# Patient Record
Sex: Male | Born: 1985 | Race: White | Hispanic: No | Marital: Single | Smoking: Current every day smoker
Health system: Southern US, Community
[De-identification: ages and names within clinical notes are randomized; demographics above are authoritative.]

## PROBLEM LIST (undated history)

## (undated) DIAGNOSIS — F909 Attention-deficit hyperactivity disorder, unspecified type: Secondary | ICD-10-CM

## (undated) HISTORY — DX: Attention-deficit hyperactivity disorder, unspecified type: F90.9

---
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
## 2003-03-24 ENCOUNTER — Encounter: Admission: RE | Admit: 2003-03-24 | Discharge: 2003-03-24 | Payer: Self-pay | Admitting: Internal Medicine

## 2003-03-24 ENCOUNTER — Encounter: Payer: Self-pay | Admitting: Internal Medicine

## 2009-06-23 ENCOUNTER — Ambulatory Visit: Payer: Self-pay | Admitting: Family Medicine

## 2009-06-24 ENCOUNTER — Telehealth (INDEPENDENT_AMBULATORY_CARE_PROVIDER_SITE_OTHER): Payer: Self-pay | Admitting: *Deleted

## 2009-06-24 LAB — CONVERTED CEMR LAB
ALT: 18 units/L (ref 0–53)
AST: 19 units/L (ref 0–37)
Albumin: 4.1 g/dL (ref 3.5–5.2)
Alkaline Phosphatase: 73 units/L (ref 39–117)
BUN: 11 mg/dL (ref 6–23)
Basophils Absolute: 0 10*3/uL (ref 0.0–0.1)
Basophils Relative: 0.9 % (ref 0.0–3.0)
Bilirubin, Direct: 0 mg/dL (ref 0.0–0.3)
CO2: 31 meq/L (ref 19–32)
Calcium: 9 mg/dL (ref 8.4–10.5)
Chlamydia, Swab/Urine, PCR: NEGATIVE
Chloride: 108 meq/L (ref 96–112)
Creatinine, Ser: 0.9 mg/dL (ref 0.4–1.5)
Eosinophils Absolute: 0.1 10*3/uL (ref 0.0–0.7)
Eosinophils Relative: 2.4 % (ref 0.0–5.0)
GC Probe Amp, Urine: NEGATIVE
GFR calc non Af Amer: 109.02 mL/min (ref >60)
Glucose, Bld: 87 mg/dL (ref 70–99)
HCT: 42.3 % (ref 39.0–52.0)
Hemoglobin: 14.2 g/dL (ref 13.0–17.0)
Lymphocytes Relative: 27.9 % (ref 12.0–46.0)
Lymphs Abs: 1.4 10*3/uL (ref 0.7–4.0)
MCHC: 33.6 g/dL (ref 30.0–36.0)
MCV: 94.9 fL (ref 78.0–100.0)
Monocytes Absolute: 0.4 10*3/uL (ref 0.1–1.0)
Monocytes Relative: 8.3 % (ref 3.0–12.0)
Neutro Abs: 3.2 10*3/uL (ref 1.4–7.7)
Neutrophils Relative %: 60.5 % (ref 43.0–77.0)
Platelets: 174 10*3/uL (ref 150.0–400.0)
Potassium: 4 meq/L (ref 3.5–5.1)
RBC: 4.46 M/uL (ref 4.22–5.81)
RDW: 12.3 % (ref 11.5–14.6)
Sodium: 141 meq/L (ref 135–145)
Total Bilirubin: 0.7 mg/dL (ref 0.3–1.2)
Total Protein: 7.2 g/dL (ref 6.0–8.3)
WBC: 5.1 10*3/uL (ref 4.5–10.5)

## 2009-07-28 ENCOUNTER — Ambulatory Visit: Payer: Self-pay | Admitting: Family Medicine

## 2009-08-27 ENCOUNTER — Ambulatory Visit: Payer: Self-pay | Admitting: Family Medicine

## 2011-01-10 ENCOUNTER — Ambulatory Visit: Payer: Self-pay | Admitting: Family Medicine

## 2011-01-10 ENCOUNTER — Encounter: Payer: Self-pay | Admitting: Family Medicine

## 2011-01-10 DIAGNOSIS — Z0289 Encounter for other administrative examinations: Secondary | ICD-10-CM

## 2013-03-05 ENCOUNTER — Ambulatory Visit (INDEPENDENT_AMBULATORY_CARE_PROVIDER_SITE_OTHER): Payer: BC Managed Care – PPO | Admitting: Family Medicine

## 2013-03-05 ENCOUNTER — Ambulatory Visit: Payer: BC Managed Care – PPO

## 2013-03-05 VITALS — BP 144/93 | HR 85 | Temp 97.7°F | Resp 16 | Ht 73.0 in | Wt 182.0 lb

## 2013-03-05 DIAGNOSIS — R52 Pain, unspecified: Secondary | ICD-10-CM

## 2013-03-05 DIAGNOSIS — R079 Chest pain, unspecified: Secondary | ICD-10-CM

## 2013-03-05 MED ORDER — HYDROCODONE-ACETAMINOPHEN 5-325 MG PO TABS: 1.0000 | ORAL_TABLET | Freq: Four times a day (QID) | ORAL | Status: DC | PRN

## 2013-03-05 NOTE — Progress Notes (Signed)
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 The pain is located in the lower left lateral ribs. We was struck by an elbow thrown during the game, he thought he heard a crack in that area. He's had pain with deep inspiration since is having trouble sleeping. He's also having trouble performing duties at work which is quite physical.  He's had no shortness of breath and does not feel any cracking at the present time.  Objective: No acute distress, patient calm and appropriate Chest: No ecchymosis, no swelling, lungs clear to auscultation, tender left lower lateral rib area. Heart: Regular no murmur or gallop Skin: No ecchymosis HEENT: Unremarkable [facility] reading (PRIMARY) by  Dr. Milus Glazier: left rib films: question distal 11th rib avulsion fx .  Assessment:  Presumed rib fx, no effusion  Plan:

## 2013-03-05 NOTE — Patient Instructions (Addendum)
Rib Fracture  Your caregiver has diagnosed you as having a rib fracture (a break). This can occur by a blow to the chest, by a fall against a hard object, or by violent coughing or sneezing. There may be one or many breaks. Rib fractures may heal on their own within 3 to 8 weeks. The longer healing period is usually associated with a continued cough or other aggravating activities.  HOME CARE INSTRUCTIONS    Avoid strenuous activity. Be careful during activities and avoid bumping the injured rib. Activities that cause pain pull on the fracture site(s) and are best avoided if possible.   Eat a normal, well-balanced diet. Drink plenty of fluids to avoid constipation.   Take deep breaths several times a day to keep lungs free of infection. Try to cough several times a day, splinting the injured area with a pillow. This will help prevent pneumonia.   Do not wear a rib belt or binder. These restrict breathing which can lead to pneumonia.   Only take over-the-counter or prescription medicines for pain, discomfort, or fever as directed by your caregiver.  SEEK MEDICAL CARE IF:   You develop a continual cough, associated with thick or bloody sputum.  SEEK IMMEDIATE MEDICAL CARE IF:    You have a fever.   You have difficulty breathing.   You have nausea (feeling sick to your stomach), vomiting, or abdominal (belly) pain.   You have worsening pain, not controlled with medications.  Document Released: 09/12/2005 Document Revised: 12/05/2011 Document Reviewed: 02/14/2007  [employer] Patient Information 2014 [employer].

## 2013-05-16 ENCOUNTER — Encounter: Payer: BC Managed Care – PPO | Admitting: Physician Assistant

## 2013-05-16 NOTE — Progress Notes (Signed)
Encounter opened in error. This encounter was created in error - please disregard. 

## 2013-06-25 ENCOUNTER — Ambulatory Visit (INDEPENDENT_AMBULATORY_CARE_PROVIDER_SITE_OTHER): Payer: BC Managed Care – PPO | Admitting: Family Medicine

## 2013-06-25 VITALS — BP 138/92 | HR 80 | Temp 98.1°F | Resp 16 | Ht 72.25 in | Wt 183.2 lb

## 2013-06-25 DIAGNOSIS — K5289 Other specified noninfective gastroenteritis and colitis: Secondary | ICD-10-CM

## 2013-06-25 DIAGNOSIS — R079 Chest pain, unspecified: Secondary | ICD-10-CM

## 2013-06-25 DIAGNOSIS — K529 Noninfective gastroenteritis and colitis, unspecified: Secondary | ICD-10-CM

## 2013-06-25 MED ORDER — LOPERAMIDE HCL 2 MG PO TABS: 2.0000 mg | ORAL_TABLET | Freq: Four times a day (QID) | ORAL | Status: DC | PRN

## 2013-06-25 MED ORDER — HYDROCODONE-ACETAMINOPHEN 5-325 MG PO TABS: 1.0000 | ORAL_TABLET | Freq: Four times a day (QID) | ORAL | Status: DC | PRN

## 2013-06-25 NOTE — Progress Notes (Signed)
Subjective:    Patient ID: Jeffery Fisher, male    DOB: [birth year], 27 y.o.   MRN: [id number] Chief Complaint  Patient presents with  . Abdominal Pain    1 day  . Nausea    1 day  . Diarrhea    1 day    HPI   Sxs started Sun night - woke in the middle of the night with vomiting and diarrhea.  Has thrown up 4x since then but no vomiting since then.  No f/c/sweats.  Still having diarrhea.  No melena or blood.  Roommate was ill with similar sxs prev.  Urinating but dark and decreased.  Has been able to eat soup today and keeping gatorade down.  History reviewed. No pertinent past medical history. No current outpatient prescriptions on file prior to visit.   No current facility-administered medications on file prior to visit.   No Known Allergies   Review of Systems  Constitutional: Positive for appetite change and fatigue. Negative for fever, chills, diaphoresis, activity change and unexpected weight change.  Respiratory: Negative for shortness of breath.   Cardiovascular: Negative for chest pain.  Gastrointestinal: Positive for nausea, vomiting, abdominal pain and diarrhea. Negative for constipation, blood in stool, abdominal distention, anal bleeding and rectal pain.  Genitourinary: Positive for decreased urine volume. Negative for dysuria, urgency, frequency and hematuria.  Hematological: Negative for adenopathy.      BP 138/92  Pulse 80  Temp(Src) 98.1 F (36.7 C) (Oral)  Resp 16  Ht 6' 0.25" (1.835 m)  Wt 183 lb 3.2 oz (83.099 kg)  BMI 24.68 kg/m2  SpO2 99% Objective:   Physical Exam  Constitutional: He appears well-developed and well-nourished. No distress.  HENT:  Head: Normocephalic and atraumatic.  Neck: Normal range of motion. Neck supple. No thyromegaly present.  Cardiovascular: Normal rate, regular rhythm and normal heart sounds.   Pulmonary/Chest: Effort normal and breath sounds normal.  Abdominal: Soft. Normal appearance and bowel sounds are normal. He  exhibits no distension and no mass. There is no hepatosplenomegaly. There is generalized tenderness (mild). There is no rigidity, no rebound, no guarding, no CVA tenderness and negative Murphy's sign. No hernia.  Genitourinary: Rectum normal and prostate normal. Rectal exam shows no tenderness and anal tone normal. Guaiac negative stool.  Lymphadenopathy:    He has no cervical adenopathy.  Skin: He is not diaphoretic.       Assessment & Plan:   Gastroenteritis  Chest pain - Plan: HYDROcodone-acetaminophen (NORCO) 5-325 MG per tablet Suspect viral and resolving.  May have chest wall strain from the vomiting - no nsaids since will upset stomach but can try tylenol or norco for breakthrough pain which should also help w/ abd cramping and diarrhea.  RTC if continues and does not resolve in the next 1-2d. Meds ordered this encounter  Medications  . HYDROcodone-acetaminophen (NORCO) 5-325 MG per tablet    Sig: Take 1 tablet by mouth every 6 (six) hours as needed for pain.    Dispense:  10 tablet    Refill:  0  . loperamide (IMODIUM A-D) 2 MG tablet    Sig: Take 1 tablet (2 mg total) by mouth 4 (four) times daily as needed for diarrhea or loose stools.    Dispense:  30 tablet    Refill:  0    I personally performed the services described in this documentation, which was scribed in my presence. The recorded information has been reviewed and considered, and addended by me as  needed.  Delman Cheadle, MD MPH

## 2013-06-25 NOTE — Patient Instructions (Addendum)
Viral Gastroenteritis Viral gastroenteritis is also known as stomach flu. This condition affects the stomach and intestinal tract. It can cause sudden diarrhea and vomiting. The illness typically lasts 3 to 8 days. Most people develop an immune response that eventually gets rid of the virus. While this natural response develops, the virus can make you quite ill. CAUSES  Many different viruses can cause gastroenteritis, such as rotavirus or noroviruses. You can catch one of these viruses by consuming contaminated food or water. You may also catch a virus by sharing utensils or other personal items with an infected person or by touching a contaminated surface. SYMPTOMS  The most common symptoms are diarrhea and vomiting. These problems can cause a severe loss of body fluids (dehydration) and a body salt (electrolyte) imbalance. Other symptoms may include:  Fever.  Headache.  Fatigue.  Abdominal pain. DIAGNOSIS  Your caregiver can usually diagnose viral gastroenteritis based on your symptoms and a physical exam. A stool sample may also be taken to test for the presence of viruses or other infections. TREATMENT  This illness typically goes away on its own. Treatments are aimed at rehydration. The most serious cases of viral gastroenteritis involve vomiting so severely that you are not able to keep fluids down. In these cases, fluids must be given through an intravenous line (IV). HOME CARE INSTRUCTIONS   Drink enough fluids to keep your urine clear or pale yellow. Drink small amounts of fluids frequently and increase the amounts as tolerated.  Ask your caregiver for specific rehydration instructions.  Avoid:  Foods high in sugar.  Alcohol.  Carbonated drinks.  Tobacco.  Juice.  Caffeine drinks.  Extremely hot or cold fluids.  Fatty, greasy foods.  Too much intake of anything at one time.  Dairy products until 24 to 48 hours after diarrhea stops.  You may consume probiotics.  Probiotics are active cultures of beneficial bacteria. They may lessen the amount and number of diarrheal stools in adults. Probiotics can be found in yogurt with active cultures and in supplements.  Wash your hands well to avoid spreading the virus.  Only take over-the-counter or prescription medicines for pain, discomfort, or fever as directed by your caregiver. Do not give aspirin to children. Antidiarrheal medicines are not recommended.  Ask your caregiver if you should continue to take your regular prescribed and over-the-counter medicines.  Keep all follow-up appointments as directed by your caregiver. SEEK IMMEDIATE MEDICAL CARE IF:   You are unable to keep fluids down.  You do not urinate at least once every 6 to 8 hours.  You develop shortness of breath.  You notice blood in your stool or vomit. This may look like coffee grounds.  You have abdominal pain that increases or is concentrated in one small area (localized).  You have persistent vomiting or diarrhea.  You have a fever.  The patient is a child younger than 3 months, and he or she has a fever.  The patient is a child older than 3 months, and he or she has a fever and persistent symptoms.  The patient is a child older than 3 months, and he or she has a fever and symptoms suddenly get worse.  The patient is a baby, and he or she has no tears when crying. MAKE SURE YOU:   Understand these instructions.  Will watch your condition.  Will get help right away if you are not doing well or get worse. Document Released: 09/12/2005 Document Revised: 12/05/2011 Document Reviewed: 06/29/2011   [employer] Patient Information 2014 [employer].  

## 2013-11-01 ENCOUNTER — Telehealth: Payer: Self-pay | Admitting: *Deleted

## 2013-11-01 NOTE — Telephone Encounter (Signed)
Appt was made. JG//CMA

## 2013-11-01 NOTE — Telephone Encounter (Signed)
Patient called and requested Adderall for school. He stated that he took it before for school, but stopped when school was out. He stated that he is now back in school. Please advise. JG//CMA

## 2013-11-01 NOTE — Telephone Encounter (Signed)
It doesn't look like he's been here in a while---needs ov

## 2013-11-05 ENCOUNTER — Ambulatory Visit: Payer: BC Managed Care – PPO | Admitting: Family Medicine

## 2013-11-07 ENCOUNTER — Ambulatory Visit (INDEPENDENT_AMBULATORY_CARE_PROVIDER_SITE_OTHER): Payer: BC Managed Care – PPO | Admitting: Family Medicine

## 2013-11-07 ENCOUNTER — Encounter: Payer: Self-pay | Admitting: Family Medicine

## 2013-11-07 VITALS — BP 126/80 | HR 84 | Temp 98.2°F | Ht 72.5 in | Wt 196.0 lb

## 2013-11-07 DIAGNOSIS — F909 Attention-deficit hyperactivity disorder, unspecified type: Secondary | ICD-10-CM

## 2013-11-07 DIAGNOSIS — F17200 Nicotine dependence, unspecified, uncomplicated: Secondary | ICD-10-CM

## 2013-11-07 DIAGNOSIS — Z23 Encounter for immunization: Secondary | ICD-10-CM

## 2013-11-07 MED ORDER — AMPHETAMINE-DEXTROAMPHET ER 20 MG PO CP24: 20.0000 mg | ORAL_CAPSULE | ORAL | Status: DC

## 2013-11-07 NOTE — Patient Instructions (Signed)
Attention Deficit Hyperactivity Disorder Attention deficit hyperactivity disorder (ADHD) is a problem with behavior issues based on the way the brain functions (neurobehavioral disorder). It is a common reason for behavior and academic problems in school. SYMPTOMS  There are 3 types of ADHD. The 3 types and some of the symptoms include:  Inattentive  Gets bored or distracted easily.  Loses or forgets things. Forgets to hand in homework.  Has trouble organizing or completing tasks.  Difficulty staying on task.  An inability to organize daily tasks and school work.  Leaving projects, chores, or homework unfinished.  Trouble paying attention or responding to details. Careless mistakes.  Difficulty following directions. Often seems like is not listening.  Dislikes activities that require sustained attention (like chores or homework).  Hyperactive-impulsive  Feels like it is impossible to sit still or stay in a seat. Fidgeting with hands and feet.  Trouble waiting turn.  Talking too much or out of turn. Interruptive.  Speaks or acts impulsively.  Aggressive, disruptive behavior.  Constantly busy or on the go, noisy.  Often leaves seat when they are expected to remain seated.  Often runs or climbs where it is not appropriate, or feels very restless.  Combined  Has symptoms of both of the above. Often children with ADHD feel discouraged about themselves and with school. They often perform well below their abilities in school. As children get older, the excess motor activities can calm down, but the problems with paying attention and staying organized persist. Most children do not outgrow ADHD but with good treatment can learn to cope with the symptoms. DIAGNOSIS  When ADHD is suspected, the diagnosis should be made by professionals trained in ADHD. This professional will collect information about the individual suspected of having ADHD. Information must be collected from  various settings where the person lives, works, or attends school.  Diagnosis will include:  Confirming symptoms began in childhood.  Ruling out other reasons for the child's behavior.  The health care providers will check with the child's school and check their medical records.  They will talk to teachers and parents.  Behavior rating scales for the child will be filled out by those dealing with the child on a daily basis. A diagnosis is made only after all information has been considered. TREATMENT  Treatment usually includes behavioral treatment, tutoring or extra support in school, and stimulant medicines. Because of the way a person's brain works with ADHD, these medicines decrease impulsivity and hyperactivity and increase attention. This is different than how they would work in a person who does not have ADHD. Other medicines used include antidepressants and certain blood pressure medicines. Most experts agree that treatment for ADHD should address all aspects of the person's functioning. Along with medicines, treatment should include structured classroom management at school. Parents should reward good behavior, provide constant discipline, and limit-setting. Tutoring should be available for the child as needed. ADHD is a life-long condition. If untreated, the disorder can have long-term serious effects into adolescence and adulthood. HOME CARE INSTRUCTIONS   Often with ADHD there is a lot of frustration among family members dealing with the condition. Blame and anger are also feelings that are common. In many cases, because the problem affects the family as a whole, the entire family may need help. A therapist can help the family find better ways to handle the disruptive behaviors of the person with ADHD and promote change. If the person with ADHD is young, most of the therapist's work   is with the parents. Parents will learn techniques for coping with and improving their child's  behavior. Sometimes only the child with the ADHD needs counseling. Your health care providers can help you make these decisions.  Children with ADHD may need help learning how to organize. Some helpful tips include:  Keep routines the same every day from wake-up time to bedtime. Schedule all activities, including homework and playtime. Keep the schedule in a place where the person with ADHD will often see it. Mark schedule changes as far in advance as possible.  Schedule outdoor and indoor recreation.  Have a place for everything and keep everything in its place. This includes clothing, backpacks, and school supplies.  Encourage writing down assignments and bringing home needed books. Work with your child's teachers for assistance in organizing school work.  Offer your child a well-balanced diet. Breakfast that includes a balance of whole grains, protein and, fruits or vegetables is especially important for school performance. Children should avoid drinks with caffeine including:  Soft drinks.  Coffee.  Tea.  However, some older children (adolescents) may find these drinks helpful in improving their attention. Because it can also be common for adolescents with ADHD to become addicted to caffeine, talk with your health care provider about what is a safe amount of caffeine intake for your child.  Children with ADHD need consistent rules that they can understand and follow. If rules are followed, give small rewards. Children with ADHD often receive, and expect, criticism. Look for good behavior and praise it. Set realistic goals. Give clear instructions. Look for activities that can foster success and self-esteem. Make time for pleasant activities with your child. Give lots of affection.  Parents are their children's greatest advocates. Learn as much as possible about ADHD. This helps you become a stronger and better advocate for your child. It also helps you educate your child's teachers and  instructors if they feel inadequate in these areas. Parent support groups are often helpful. A national group with local chapters is called Children and Adults with Attention Deficit Hyperactivity Disorder (CHADD). SEEK MEDICAL CARE IF:  Your child has repeated muscle twitches, cough or speech outbursts.  Your child has sleep problems.  Your child has a marked loss of appetite.  Your child develops depression.  Your child has new or worsening behavioral problems.  Your child develops dizziness.  Your child has a racing heart.  Your child has stomach pains.  Your child develops headaches. SEEK IMMEDIATE MEDICAL CARE IF:  Your child has been diagnosed with depression or anxiety and the symptoms seem to be getting worse.  Your child has been depressed and suddenly appears to have increased energy or motivation.  You are worried that your child is having a bad reaction to a medication he or she is taking for ADHD. Document Released: 09/02/2002 Document Revised: 07/03/2013 Document Reviewed: 05/20/2013 [employer] Patient Information 2014 [employer].  

## 2013-11-07 NOTE — Progress Notes (Signed)
Pre visit review using our clinic review tool, if applicable. No additional management support is needed unless otherwise documented below in the visit note. 

## 2013-11-07 NOTE — Progress Notes (Signed)
Patient ID: Terese DoorRyan E Budden, male   DOB: Dec 07, 1985, 28 y.o.   MRN: [id number]   Subjective:    Patient ID: Terese DoorRyan E Pietsch, male    DOB: Dec 07, 1985, 28 y.o.   MRN: [id number] HPI Pt here to discuss starting back on adderall. He is going back to school and would like to start back.   Adult self questionaire done.         Objective:    BP 126/80  Pulse 84  Temp(Src) 98.2 F (36.8 C) (Oral)  Ht 6' 0.5" (1.842 m)  Wt 196 lb (88.905 kg)  BMI 26.20 kg/m2  SpO2 97% General appearance: alert, cooperative, appears stated age and no distress Throat: lips, mucosa, and tongue normal; teeth and gums normal Neck: no adenopathy, no JVD and thyroid not enlarged, symmetric, no tenderness/mass/nodules Lungs: clear to auscultation bilaterally Heart: S1, S2 normal         Assessment & Plan:  1. ADHD (attention deficit hyperactivity disorder) Restart med rto  1 month - amphetamine-dextroamphetamine (ADDERALL XR) 20 MG 24 hr capsule; Take 1 capsule (20 mg total) by mouth every morning.  Dispense: 30 capsule; Refill: 0  2. Tobacco use disorder Discuss with pt quitting  3. Need for prophylactic vaccination and inoculation against influenza  - Flu Vaccine QUAD 36+ mos PF IM (Fluarix)

## 2013-11-08 ENCOUNTER — Telehealth: Payer: Self-pay | Admitting: Family Medicine

## 2013-11-08 NOTE — Telephone Encounter (Signed)
Relevant patient education assigned to patient using Emmi. ° °

## 2013-12-04 ENCOUNTER — Telehealth: Payer: Self-pay | Admitting: *Deleted

## 2013-12-04 NOTE — Telephone Encounter (Signed)
UDS collected on 2/12/5 and results reviewed by Dr. Laury AxonLowne. Per her written note "Patient + for opiates and THC.

## 2013-12-12 ENCOUNTER — Ambulatory Visit (INDEPENDENT_AMBULATORY_CARE_PROVIDER_SITE_OTHER): Payer: BC Managed Care – PPO | Admitting: Family Medicine

## 2013-12-12 ENCOUNTER — Encounter: Payer: Self-pay | Admitting: Family Medicine

## 2013-12-12 VITALS — BP 120/82 | HR 90 | Temp 97.9°F | Wt 190.0 lb

## 2013-12-12 DIAGNOSIS — F909 Attention-deficit hyperactivity disorder, unspecified type: Secondary | ICD-10-CM

## 2013-12-12 MED ORDER — AMPHETAMINE-DEXTROAMPHET ER 20 MG PO CP24: 20.0000 mg | ORAL_CAPSULE | ORAL | Status: DC

## 2013-12-12 NOTE — Progress Notes (Signed)
Patient ID: Terese DoorRyan E Pribble, male   DOB: [birth year], 28 y.o.   MRN: [id number]   Subjective:    Patient ID: Terese DoorRyan E Parrack, male    DOB: [birth year], 28 y.o.   MRN: [id number] HPI Pt here f/u add meds.  He is doing well and meds wear off just in time to sleep.           Objective:    BP 120/82  Pulse 90  Temp(Src) 97.9 F (36.6 C) (Oral)  Wt 190 lb (86.183 kg)  SpO2 97% General appearance: alert, cooperative, appears stated age and no distress Neurologic: Alert and oriented X 3, normal strength and tone. Normal symmetric reflexes. Normal coordination and gait       Assessment & Plan:  1. ADHD (attention deficit hyperactivity disorder) Stable, con't meds  Recheck 6 months or sooner prn - amphetamine-dextroamphetamine (ADDERALL XR) 20 MG 24 hr capsule; Take 1 capsule (20 mg total) by mouth every morning.  Dispense: 30 capsule; Refill: 0 - amphetamine-dextroamphetamine (ADDERALL XR) 20 MG 24 hr capsule; Take 1 capsule (20 mg total) by mouth every morning.  Dispense: 30 capsule; Refill: 0 - amphetamine-dextroamphetamine (ADDERALL XR) 20 MG 24 hr capsule; Take 1 capsule (20 mg total) by mouth every morning.  Dispense: 30 capsule; Refill: 0

## 2013-12-12 NOTE — Progress Notes (Signed)
Pre visit review using our clinic review tool, if applicable. No additional management support is needed unless otherwise documented below in the visit note. 

## 2013-12-12 NOTE — Patient Instructions (Signed)
Attention Deficit Hyperactivity Disorder Attention deficit hyperactivity disorder (ADHD) is a problem with behavior issues based on the way the brain functions (neurobehavioral disorder). It is a common reason for behavior and academic problems in school. SYMPTOMS  There are 3 types of ADHD. The 3 types and some of the symptoms include:  Inattentive  Gets bored or distracted easily.  Loses or forgets things. Forgets to hand in homework.  Has trouble organizing or completing tasks.  Difficulty staying on task.  An inability to organize daily tasks and school work.  Leaving projects, chores, or homework unfinished.  Trouble paying attention or responding to details. Careless mistakes.  Difficulty following directions. Often seems like is not listening.  Dislikes activities that require sustained attention (like chores or homework).  Hyperactive-impulsive  Feels like it is impossible to sit still or stay in a seat. Fidgeting with hands and feet.  Trouble waiting turn.  Talking too much or out of turn. Interruptive.  Speaks or acts impulsively.  Aggressive, disruptive behavior.  Constantly busy or on the go, noisy.  Often leaves seat when they are expected to remain seated.  Often runs or climbs where it is not appropriate, or feels very restless.  Combined  Has symptoms of both of the above. Often children with ADHD feel discouraged about themselves and with school. They often perform well below their abilities in school. As children get older, the excess motor activities can calm down, but the problems with paying attention and staying organized persist. Most children do not outgrow ADHD but with good treatment can learn to cope with the symptoms. DIAGNOSIS  When ADHD is suspected, the diagnosis should be made by professionals trained in ADHD. This professional will collect information about the individual suspected of having ADHD. Information must be collected from  various settings where the person lives, works, or attends school.  Diagnosis will include:  Confirming symptoms began in childhood.  Ruling out other reasons for the child's behavior.  The health care providers will check with the child's school and check their medical records.  They will talk to teachers and parents.  Behavior rating scales for the child will be filled out by those dealing with the child on a daily basis. A diagnosis is made only after all information has been considered. TREATMENT  Treatment usually includes behavioral treatment, tutoring or extra support in school, and stimulant medicines. Because of the way a person's brain works with ADHD, these medicines decrease impulsivity and hyperactivity and increase attention. This is different than how they would work in a person who does not have ADHD. Other medicines used include antidepressants and certain blood pressure medicines. Most experts agree that treatment for ADHD should address all aspects of the person's functioning. Along with medicines, treatment should include structured classroom management at school. Parents should reward good behavior, provide constant discipline, and limit-setting. Tutoring should be available for the child as needed. ADHD is a life-long condition. If untreated, the disorder can have long-term serious effects into adolescence and adulthood. HOME CARE INSTRUCTIONS   Often with ADHD there is a lot of frustration among family members dealing with the condition. Blame and anger are also feelings that are common. In many cases, because the problem affects the family as a whole, the entire family may need help. A therapist can help the family find better ways to handle the disruptive behaviors of the person with ADHD and promote change. If the person with ADHD is young, most of the therapist's work   is with the parents. Parents will learn techniques for coping with and improving their child's  behavior. Sometimes only the child with the ADHD needs counseling. Your health care providers can help you make these decisions.  Children with ADHD may need help learning how to organize. Some helpful tips include:  Keep routines the same every day from wake-up time to bedtime. Schedule all activities, including homework and playtime. Keep the schedule in a place where the person with ADHD will often see it. Mark schedule changes as far in advance as possible.  Schedule outdoor and indoor recreation.  Have a place for everything and keep everything in its place. This includes clothing, backpacks, and school supplies.  Encourage writing down assignments and bringing home needed books. Work with your child's teachers for assistance in organizing school work.  Offer your child a well-balanced diet. Breakfast that includes a balance of whole grains, protein and, fruits or vegetables is especially important for school performance. Children should avoid drinks with caffeine including:  Soft drinks.  Coffee.  Tea.  However, some older children (adolescents) may find these drinks helpful in improving their attention. Because it can also be common for adolescents with ADHD to become addicted to caffeine, talk with your health care provider about what is a safe amount of caffeine intake for your child.  Children with ADHD need consistent rules that they can understand and follow. If rules are followed, give small rewards. Children with ADHD often receive, and expect, criticism. Look for good behavior and praise it. Set realistic goals. Give clear instructions. Look for activities that can foster success and self-esteem. Make time for pleasant activities with your child. Give lots of affection.  Parents are their children's greatest advocates. Learn as much as possible about ADHD. This helps you become a stronger and better advocate for your child. It also helps you educate your child's teachers and  instructors if they feel inadequate in these areas. Parent support groups are often helpful. A national group with local chapters is called Children and Adults with Attention Deficit Hyperactivity Disorder (CHADD). SEEK MEDICAL CARE IF:  Your child has repeated muscle twitches, cough or speech outbursts.  Your child has sleep problems.  Your child has a marked loss of appetite.  Your child develops depression.  Your child has new or worsening behavioral problems.  Your child develops dizziness.  Your child has a racing heart.  Your child has stomach pains.  Your child develops headaches. SEEK IMMEDIATE MEDICAL CARE IF:  Your child has been diagnosed with depression or anxiety and the symptoms seem to be getting worse.  Your child has been depressed and suddenly appears to have increased energy or motivation.  You are worried that your child is having a bad reaction to a medication he or she is taking for ADHD. Document Released: 09/02/2002 Document Revised: 07/03/2013 Document Reviewed: 05/20/2013 [employer] Patient Information 2014 [employer].  

## 2013-12-13 ENCOUNTER — Telehealth: Payer: Self-pay | Admitting: Family Medicine

## 2013-12-13 NOTE — Telephone Encounter (Signed)
Relevant patient education assigned to patient using Emmi. ° °

## 2013-12-19 ENCOUNTER — Encounter: Payer: Self-pay | Admitting: Family Medicine

## 2014-01-10 ENCOUNTER — Inpatient Hospital Stay (HOSPITAL_COMMUNITY)
Admission: EM | Admit: 2014-01-10 | Discharge: 2014-01-11 | DRG: 918 | Disposition: A | Discharge status: Home / Self Care | Payer: BC Managed Care – PPO | Attending: Internal Medicine | Admitting: Internal Medicine

## 2014-01-10 ENCOUNTER — Observation Stay (HOSPITAL_COMMUNITY): Payer: BC Managed Care – PPO

## 2014-01-10 ENCOUNTER — Encounter (HOSPITAL_COMMUNITY): Payer: Self-pay | Admitting: Emergency Medicine

## 2014-01-10 DIAGNOSIS — R000 Tachycardia, unspecified: Secondary | ICD-10-CM | POA: Diagnosis present

## 2014-01-10 DIAGNOSIS — F909 Attention-deficit hyperactivity disorder, unspecified type: Secondary | ICD-10-CM | POA: Diagnosis present

## 2014-01-10 DIAGNOSIS — F1910 Other psychoactive substance abuse, uncomplicated: Secondary | ICD-10-CM | POA: Diagnosis present

## 2014-01-10 DIAGNOSIS — F17200 Nicotine dependence, unspecified, uncomplicated: Secondary | ICD-10-CM | POA: Diagnosis present

## 2014-01-10 DIAGNOSIS — F1410 Cocaine abuse, uncomplicated: Secondary | ICD-10-CM | POA: Diagnosis present

## 2014-01-10 DIAGNOSIS — T401X4A Poisoning by heroin, undetermined, initial encounter: Principal | ICD-10-CM | POA: Diagnosis present

## 2014-01-10 DIAGNOSIS — F10929 Alcohol use, unspecified with intoxication, unspecified: Secondary | ICD-10-CM

## 2014-01-10 DIAGNOSIS — Z6825 Body mass index (BMI) 25.0-25.9, adult: Secondary | ICD-10-CM

## 2014-01-10 DIAGNOSIS — Y92009 Unspecified place in unspecified non-institutional (private) residence as the place of occurrence of the external cause: Secondary | ICD-10-CM

## 2014-01-10 DIAGNOSIS — I4891 Unspecified atrial fibrillation: Secondary | ICD-10-CM

## 2014-01-10 DIAGNOSIS — F1110 Opioid abuse, uncomplicated: Secondary | ICD-10-CM | POA: Diagnosis present

## 2014-01-10 DIAGNOSIS — T401X1A Poisoning by heroin, accidental (unintentional), initial encounter: Secondary | ICD-10-CM

## 2014-01-10 LAB — BASIC METABOLIC PANEL
BUN: 10 mg/dL (ref 6–23)
CHLORIDE: 100 meq/L (ref 96–112)
CO2: 25 meq/L (ref 19–32)
Calcium: 8.6 mg/dL (ref 8.4–10.5)
Creatinine, Ser: 0.99 mg/dL (ref 0.50–1.35)
GFR calc Af Amer: >90 mL/min (ref >90)
GFR calc non Af Amer: >90 mL/min (ref >90)
GLUCOSE: 186 mg/dL — AB (ref 70–99)
POTASSIUM: 4 meq/L (ref 3.7–5.3)
SODIUM: 139 meq/L (ref 137–147)

## 2014-01-10 LAB — CBC WITH DIFFERENTIAL/PLATELET
Basophils Absolute: 0.1 10*3/uL (ref 0.0–0.1)
Basophils Relative: 1 % (ref 0–1)
EOS PCT: 1 % (ref 0–5)
Eosinophils Absolute: 0.1 10*3/uL (ref 0.0–0.7)
HCT: 45.6 % (ref 39.0–52.0)
Hemoglobin: 15.1 g/dL (ref 13.0–17.0)
LYMPHS ABS: 3.5 10*3/uL (ref 0.7–4.0)
LYMPHS PCT: 37 % (ref 12–46)
MCH: 32.5 pg (ref 26.0–34.0)
MCHC: 33.1 g/dL (ref 30.0–36.0)
MCV: 98.1 fL (ref 78.0–100.0)
Monocytes Absolute: 0.6 10*3/uL (ref 0.1–1.0)
Monocytes Relative: 6 % (ref 3–12)
NEUTROS ABS: 5.2 10*3/uL (ref 1.7–7.7)
NEUTROS PCT: 56 % (ref 43–77)
PLATELETS: 220 10*3/uL (ref 150–400)
RBC: 4.65 MIL/uL (ref 4.22–5.81)
RDW: 13.4 % (ref 11.5–15.5)
WBC: 9.4 10*3/uL (ref 4.0–10.5)

## 2014-01-10 LAB — TSH: TSH: 1.82 u[IU]/mL (ref 0.350–4.500)

## 2014-01-10 LAB — RAPID URINE DRUG SCREEN, HOSP PERFORMED
AMPHETAMINES: NOT DETECTED
BARBITURATES: NOT DETECTED
Benzodiazepines: NOT DETECTED
Cocaine: NOT DETECTED
Opiates: POSITIVE — AB
Tetrahydrocannabinol: POSITIVE — AB

## 2014-01-10 LAB — HEPARIN LEVEL (UNFRACTIONATED): HEPARIN UNFRACTIONATED: 0.81 [IU]/mL — AB (ref 0.30–0.70)

## 2014-01-10 LAB — TROPONIN I: Troponin I: <0.3 ng/mL (ref <0.30)

## 2014-01-10 LAB — MRSA PCR SCREENING: MRSA by PCR: NEGATIVE

## 2014-01-10 LAB — ETHANOL: ALCOHOL ETHYL (B): 276 mg/dL — AB (ref 0–11)

## 2014-01-10 LAB — APTT: APTT: 21 s — AB (ref 24–37)

## 2014-01-10 LAB — PROTIME-INR
INR: 0.95 (ref 0.00–1.49)
PROTHROMBIN TIME: 12.5 s (ref 11.6–15.2)

## 2014-01-10 MED ORDER — LORAZEPAM 1 MG PO TABS: 0.0000 mg | ORAL_TABLET | Freq: Two times a day (BID) | ORAL | Status: DC

## 2014-01-10 MED ORDER — SODIUM CHLORIDE 0.9 % IV BOLUS (SEPSIS)
1000.0000 mL | Freq: Once | INTRAVENOUS | Status: AC
  Administered 2014-01-10: 1000 mL via INTRAVENOUS

## 2014-01-10 MED ORDER — SODIUM CHLORIDE 0.9 % IJ SOLN
3.0000 mL | Freq: Two times a day (BID) | INTRAMUSCULAR | Status: DC
  Administered 2014-01-10 – 2014-01-11 (×2): 3 mL via INTRAVENOUS

## 2014-01-10 MED ORDER — HEPARIN (PORCINE) IN NACL 100-0.45 UNIT/ML-% IJ SOLN
1500.0000 [IU]/h | INTRAMUSCULAR | Status: DC
  Filled 2014-01-10: qty 250

## 2014-01-10 MED ORDER — THIAMINE HCL 100 MG/ML IJ SOLN
100.0000 mg | Freq: Every day | INTRAMUSCULAR | Status: DC
  Filled 2014-01-10 (×2): qty 1

## 2014-01-10 MED ORDER — HEPARIN (PORCINE) IN NACL 100-0.45 UNIT/ML-% IJ SOLN
1650.0000 [IU]/h | INTRAMUSCULAR | Status: DC
  Administered 2014-01-10: 1650 [IU]/h via INTRAVENOUS
  Filled 2014-01-10 (×2): qty 250

## 2014-01-10 MED ORDER — VITAMIN B-1 100 MG PO TABS
100.0000 mg | ORAL_TABLET | Freq: Every day | ORAL | Status: DC
  Administered 2014-01-10 – 2014-01-11 (×2): 100 mg via ORAL
  Filled 2014-01-10 (×2): qty 1

## 2014-01-10 MED ORDER — DILTIAZEM HCL 100 MG IV SOLR
5.0000 mg/h | INTRAVENOUS | Status: DC
  Administered 2014-01-10: 5 mg/h via INTRAVENOUS
  Filled 2014-01-10: qty 100

## 2014-01-10 MED ORDER — LORAZEPAM 1 MG PO TABS
0.0000 mg | ORAL_TABLET | Freq: Four times a day (QID) | ORAL | Status: DC
  Administered 2014-01-10: 1 mg via ORAL
  Administered 2014-01-10: 2 mg via ORAL
  Filled 2014-01-10 (×2): qty 1
  Filled 2014-01-10: qty 2

## 2014-01-10 MED ORDER — DILTIAZEM LOAD VIA INFUSION
25.0000 mg | Freq: Once | INTRAVENOUS | Status: AC
  Administered 2014-01-10: 25 mg via INTRAVENOUS
  Filled 2014-01-10: qty 25

## 2014-01-10 MED ORDER — HEPARIN BOLUS VIA INFUSION
2500.0000 [IU] | Freq: Once | INTRAVENOUS | Status: AC
  Administered 2014-01-10: 2500 [IU] via INTRAVENOUS
  Filled 2014-01-10: qty 2500

## 2014-01-10 MED ORDER — LORAZEPAM 1 MG PO TABS: 1.0000 mg | ORAL_TABLET | Freq: Four times a day (QID) | ORAL | Status: DC | PRN

## 2014-01-10 MED ORDER — LORAZEPAM 2 MG/ML IJ SOLN: 1.0000 mg | Freq: Four times a day (QID) | INTRAMUSCULAR | Status: DC | PRN

## 2014-01-10 MED ORDER — FOLIC ACID 1 MG PO TABS
1.0000 mg | ORAL_TABLET | Freq: Every day | ORAL | Status: DC
  Administered 2014-01-10 – 2014-01-11 (×2): 1 mg via ORAL
  Filled 2014-01-10 (×2): qty 1

## 2014-01-10 MED ORDER — ADULT MULTIVITAMIN W/MINERALS CH
1.0000 | ORAL_TABLET | Freq: Every day | ORAL | Status: DC
  Administered 2014-01-10 – 2014-01-11 (×2): 1 via ORAL
  Filled 2014-01-10 (×2): qty 1

## 2014-01-10 MED ORDER — ASPIRIN 81 MG PO CHEW
81.0000 mg | CHEWABLE_TABLET | Freq: Once | ORAL | Status: AC
  Administered 2014-01-10: 81 mg via ORAL
  Filled 2014-01-10: qty 1

## 2014-01-10 MED ORDER — DILTIAZEM HCL 30 MG PO TABS
30.0000 mg | ORAL_TABLET | Freq: Four times a day (QID) | ORAL | Status: DC
  Administered 2014-01-10 – 2014-01-11 (×6): 30 mg via ORAL
  Filled 2014-01-10 (×10): qty 1

## 2014-01-10 NOTE — ED Notes (Signed)
Bed: RESB Expected date:  Expected time:  Means of arrival:  Comments: EMS/overdose heroin

## 2014-01-10 NOTE — ED Notes (Signed)
Pt aware of need for urine but is unable to provide one at this time.

## 2014-01-10 NOTE — ED Provider Notes (Signed)
CSN: [id number]     Arrival date & time 01/10/14  0210 History   None    Chief Complaint  Patient presents with  . Drug Overdose     (Consider location/radiation/quality/duration/timing/severity/associated sxs/prior Treatment) HPI This 28 year old male with a history of polysubstance abuse. He was found unresponsive by a friend about 45 minutes after injecting heroin. The friend removed him from the truck he was in and laid him back down on the ground on his side. EMS was summoned and they found the patient to be nearly unresponsive and with diminished respirations. He was administered 1 mg of Narcan IV with return to consciousness and normal respirations. He admits to injecting heroin and states that it was pink which is not how his usual heroin appears so it may have been tainted. He is also been drinking alcohol and uses cocaine. He denies any suicidal intent stating this was recreational use. He was noted to be tachycardic on arrival. He has no history of arrhythmias. His only acute complaint is being cold and he was noted to be shivering.  Past Medical History  Diagnosis Date  . ADHD (attention deficit hyperactivity disorder)    History reviewed. No pertinent past surgical history. History reviewed. No pertinent family history. History  Substance Use Topics  . Smoking status: Current Every Day Smoker -- 0.50 packs/day for 10 years    Types: Cigarettes  . Smokeless tobacco: Never Used  . Alcohol Use: 1.2 oz/week    2 Cans of beer per week    Review of Systems  All other systems reviewed and are negative.  Allergies  Review of patient's allergies indicates no known allergies.  Home Medications   Prior to Admission medications   Medication Sig Start Date End Date Taking? Authorizing Provider  amphetamine-dextroamphetamine (ADDERALL XR) 20 MG 24 hr capsule Take 1 capsule (20 mg total) by mouth every morning. 11/07/13   Lelon PerlaYvonne R Lowne, DO  amphetamine-dextroamphetamine (ADDERALL  XR) 20 MG 24 hr capsule Take 1 capsule (20 mg total) by mouth every morning. 12/12/13   Lelon PerlaYvonne R Lowne, DO  amphetamine-dextroamphetamine (ADDERALL XR) 20 MG 24 hr capsule Take 1 capsule (20 mg total) by mouth every morning. 12/12/13   Lelon PerlaYvonne R Lowne, DO  amphetamine-dextroamphetamine (ADDERALL XR) 20 MG 24 hr capsule Take 1 capsule (20 mg total) by mouth every morning. 12/12/13   Yvonne R Lowne, DO   BP 150/105  Pulse 156  Temp(Src) 97.7 F (36.5 C) (Oral)  Resp 15  Ht 6\' 1"  (1.854 m)  Wt 190 lb (86.183 kg)  BMI 25.07 kg/m2  SpO2 94%  Physical Exam General: Well-developed, well-nourished male in no acute distress; appearance consistent with age of record HENT: normocephalic; atraumatic Eyes: pupils equal, round and reactive to light; extraocular muscles intact Neck: supple Heart: Irregular rhythm; tachycardic Lungs: clear to auscultation bilaterally Abdomen: soft; nondistended; nontender; no masses or hepatosplenomegaly; bowel sounds present Extremities: No deformity; full range of motion; pulses normal Neurologic: Awake, alert and oriented; slurred speech; motor function intact in all extremities and symmetric; no facial droop; shivering Skin: Warm and dry Psychiatric: Normal mood and affect; denies SI or HI    ED Course  Procedures (including critical care time)   MDM   Nursing notes and vitals signs, including pulse oximetry, reviewed.  Summary of this visit's results, reviewed by myself:  Labs:  Results for orders placed during the hospital encounter of 01/10/14 (from the past 24 hour(s))  ETHANOL     Status: Abnormal  Collection Time    01/10/14  2:15 AM      Result Value Ref Range   Alcohol, Ethyl (B) 276 (*) 0 - 11 mg/dL  CBC WITH DIFFERENTIAL     Status: None   Collection Time    01/10/14  2:15 AM      Result Value Ref Range   WBC 9.4  4.0 - 10.5 K/uL   RBC 4.65  4.22 - 5.81 MIL/uL   Hemoglobin 15.1  13.0 - 17.0 g/dL   HCT 16.145.6  09.639.0 - 04.552.0 %   MCV 98.1   78.0 - 100.0 fL   MCH 32.5  26.0 - 34.0 pg   MCHC 33.1  30.0 - 36.0 g/dL   RDW 40.913.4  81.111.5 - 91.415.5 %   Platelets 220  150 - 400 K/uL   Neutrophils Relative % 56  43 - 77 %   Neutro Abs 5.2  1.7 - 7.7 K/uL   Lymphocytes Relative 37  12 - 46 %   Lymphs Abs 3.5  0.7 - 4.0 K/uL   Monocytes Relative 6  3 - 12 %   Monocytes Absolute 0.6  0.1 - 1.0 K/uL   Eosinophils Relative 1  0 - 5 %   Eosinophils Absolute 0.1  0.0 - 0.7 K/uL   Basophils Relative 1  0 - 1 %   Basophils Absolute 0.1  0.0 - 0.1 K/uL  BASIC METABOLIC PANEL     Status: Abnormal   Collection Time    01/10/14  2:15 AM      Result Value Ref Range   Sodium 139  137 - 147 mEq/L   Potassium 4.0  3.7 - 5.3 mEq/L   Chloride 100  96 - 112 mEq/L   CO2 25  19 - 32 mEq/L   Glucose, Bld 186 (*) 70 - 99 mg/dL   BUN 10  6 - 23 mg/dL   Creatinine, Ser 7.820.99  0.50 - 1.35 mg/dL   Calcium 8.6  8.4 - 95.610.5 mg/dL   GFR calc non Af Amer >90  >90 mL/min   GFR calc Af Amer >90  >90 mL/min     EKG Interpretation:  Date & Time: 01/10/2014 2:11 AM  Rate: 156  Rhythm: Atrial fibrillation with rapid ventricular response  QRS Axis: normal  Intervals: normal  ST/T Wave abnormalities: normal  Conduction Disutrbances:nonspecific intraventricular conduction delay  Narrative Interpretation:   Old EKG Reviewed: none available  2:29 AM Cardizem initiated for treatment of rapid atrial fibrillation.  4:20 AM Rate about 100 on Cardizem drip at 10 mg per hour. We'll have the patient admitted for new-onset atrial fibrillation.   Hanley SeamenJohn L Yaminah Clayborn, MD 01/10/14 [phone]

## 2014-01-10 NOTE — ED Notes (Signed)
[employer] at bedside 

## 2014-01-10 NOTE — ED Notes (Signed)
Pt belongings are in a white Pt belongings bag, with a sticker label on it. His black watch is also with his belongings.

## 2014-01-10 NOTE — ED Notes (Signed)
Pt O2 sat dropped down to 84% on RA.  Pt put on 2L [place] and O2 sats now 98%.

## 2014-01-10 NOTE — Consult Note (Signed)
Reason for Consult: atrial fib   Referring Physician:  Dr. Daleen Bo NWG:NFAOZH Etter Sjogren, DO [name] is an 28 y.o. male.    Chief Complaint:  Pt admitted today for heroin overdose, responded to narcan and was found to be in A fib RVR.   HPI: 28 y.o. male who is brought in for heroin overdose. He was initially unresponsive earlier on evening of 01/09/14, EMS was summoned and he was given narcan which brought him back. He admits to snorting a line of heroin this evening, heroin was pink which is not its usual color so he suspects it may have been laced with something. Additionally he uses cocaine (his story keeps changing on when his last use was, yesterday vs 2 weeks ago). He admits to drinking EtOH all day today. States he hasnt used adderall for a while and only uses it for studying. Today he stated he rarely used heroin maybe twice.  Did drink between a couple of friends a fifth of ETOH.  Pt was tachycardic on arrival at 156, EKG with afib with RVR.   Pt was placed on IV cardizem after a bolus.   Today around 1227 pt converted to SR his cardizem has been stopped.  Heparin continues.  Echo has been done but not read.  Troponin I neg. Lytes stable.  Drug screen pos. for opiates and marijuana.      Past Medical History  Diagnosis Date  . ADHD (attention deficit hyperactivity disorder)     History reviewed. No pertinent past surgical history.  Family History  Problem Relation Age of Onset  . Healthy Mother   . Healthy Father   . Healthy Sister   . Healthy Sister    Social History:  reports that he has been smoking Cigarettes.  He has a 5 pack-year smoking history. He has never used smokeless tobacco. He reports that he drinks about 1.2 ounces of alcohol per week. He reports that he uses illicit drugs (Cocaine). Single  A student  Allergies: No Known Allergies  No prescriptions prior to admission    Results for orders placed during the  hospital encounter of 01/10/14 (from the past 48 hour(s))  ETHANOL     Status: Abnormal   Collection Time    01/10/14  2:15 AM      Result Value Ref Range   Alcohol, Ethyl (B) 276 (*) 0 - 11 mg/dL   Comment:            LOWEST DETECTABLE LIMIT FOR     SERUM ALCOHOL IS 11 mg/dL     FOR MEDICAL PURPOSES ONLY  CBC WITH DIFFERENTIAL     Status: None   Collection Time    01/10/14  2:15 AM      Result Value Ref Range   WBC 9.4  4.0 - 10.5 K/uL   RBC 4.65  4.22 - 5.81 MIL/uL   Hemoglobin 15.1  13.0 - 17.0 g/dL   HCT 45.6  39.0 - 52.0 %   MCV 98.1  78.0 - 100.0 fL   MCH 32.5  26.0 - 34.0 pg   MCHC 33.1  30.0 - 36.0 g/dL   RDW 13.4  11.5 - 15.5 %   Platelets 220  150 - 400 K/uL   Neutrophils Relative % 56  43 - 77 %   Neutro Abs 5.2  1.7 - 7.7 K/uL   Lymphocytes Relative 37  12 - 46 %  Lymphs Abs 3.5  0.7 - 4.0 K/uL   Monocytes Relative 6  3 - 12 %   Monocytes Absolute 0.6  0.1 - 1.0 K/uL   Eosinophils Relative 1  0 - 5 %   Eosinophils Absolute 0.1  0.0 - 0.7 K/uL   Basophils Relative 1  0 - 1 %   Basophils Absolute 0.1  0.0 - 0.1 K/uL  BASIC METABOLIC PANEL     Status: Abnormal   Collection Time    01/10/14  2:15 AM      Result Value Ref Range   Sodium 139  137 - 147 mEq/L   Potassium 4.0  3.7 - 5.3 mEq/L   Chloride 100  96 - 112 mEq/L   CO2 25  19 - 32 mEq/L   Glucose, Bld 186 (*) 70 - 99 mg/dL   BUN 10  6 - 23 mg/dL   Creatinine, Ser 0.99  0.50 - 1.35 mg/dL   Calcium 8.6  8.4 - 10.5 mg/dL   GFR calc non Af Amer >90  >90 mL/min   GFR calc Af Amer >90  >90 mL/min   Comment: (NOTE)     The eGFR has been calculated using the CKD EPI equation.     This calculation has not been validated in all clinical situations.     eGFR's persistently <90 mL/min signify possible Chronic Kidney     Disease.  TROPONIN I     Status: None   Collection Time    01/10/14  5:00 AM      Result Value Ref Range   Troponin I <0.30  <0.30 ng/mL   Comment:            Due to the release kinetics of  cTnI,     a negative result within the first hours     of the onset of symptoms does not rule out     myocardial infarction with certainty.     If myocardial infarction is still suspected,     repeat the test at appropriate intervals.  APTT     Status: Abnormal   Collection Time    01/10/14  5:35 AM      Result Value Ref Range   aPTT 21 (*) 24 - 37 seconds  PROTIME-INR     Status: None   Collection Time    01/10/14  5:35 AM      Result Value Ref Range   Prothrombin Time 12.5  11.6 - 15.2 seconds   INR 0.95  0.00 - 1.49  URINE RAPID DRUG SCREEN (HOSP PERFORMED)     Status: Abnormal   Collection Time    01/10/14  6:16 AM      Result Value Ref Range   Opiates POSITIVE (*) NONE DETECTED   Cocaine NONE DETECTED  NONE DETECTED   Benzodiazepines NONE DETECTED  NONE DETECTED   Amphetamines NONE DETECTED  NONE DETECTED   Tetrahydrocannabinol POSITIVE (*) NONE DETECTED   Barbiturates NONE DETECTED  NONE DETECTED   Comment:            DRUG SCREEN FOR MEDICAL PURPOSES     ONLY.  IF CONFIRMATION IS NEEDED     FOR ANY PURPOSE, NOTIFY LAB     WITHIN 5 DAYS.                LOWEST DETECTABLE LIMITS     FOR URINE DRUG SCREEN     Drug Class       Cutoff (ng/mL)  Amphetamine      1000     Barbiturate      200     Benzodiazepine   834     Tricyclics       196     Opiates          300     Cocaine          300     THC              50  MRSA PCR SCREENING     Status: None   Collection Time    01/10/14  6:16 AM      Result Value Ref Range   MRSA by PCR NEGATIVE  NEGATIVE   Comment:            The GeneXpert MRSA Assay (FDA     approved for NASAL specimens     only), is one component of a     comprehensive MRSA colonization     surveillance program. It is not     intended to diagnose MRSA     infection nor to guide or     monitor treatment for     MRSA infections.  HEPARIN LEVEL (UNFRACTIONATED)     Status: Abnormal   Collection Time    01/10/14  1:32 PM      Result Value Ref  Range   Heparin Unfractionated 0.81 (*) 0.30 - 0.70 IU/mL   Comment:            IF HEPARIN RESULTS ARE BELOW     EXPECTED VALUES, AND PATIENT     DOSAGE HAS BEEN CONFIRMED,     SUGGEST FOLLOW UP TESTING     OF ANTITHROMBIN III LEVELS.   Dg Chest 1 View  01/10/2014   CLINICAL DATA:  Cough, smoker  EXAM: CHEST - 1 VIEW  COMPARISON:  03/05/2013  FINDINGS: Cardiomediastinal silhouette is unremarkable. No acute infiltrate or pleural effusion. No pulmonary edema. Central mild bronchitic changes. Bony thorax is stable.  IMPRESSION: No acute infiltrate or pulmonary edema. Central mild bronchitic changes.   Electronically Signed   By: Lahoma Crocker M.D.   On: 01/10/2014 09:48    ROS: General:no colds or fevers, no weight changes Skin:no rashes or ulcers HEENT:no blurred vision, no congestion CV:see HPI- no previous tachycardia that he is aware  PUL:see HPI GI:no diarrhea constipation or melena, no indigestion GU:no hematuria, no dysuria MS:no joint pain, no claudication Neuro:no syncope, no lightheadedness, no hx of strokes Endo:no diabetes, no thyroid disease   Blood pressure 121/68, pulse 76, temperature 97.9 F (36.6 C), temperature source Oral, resp. rate 11, height '6\' 1"'  (1.854 m), weight 190 lb (86.183 kg), SpO2 99.00%. PE: General:Pleasant affect, NAD Skin:Warm and dry, brisk capillary refill HEENT:normocephalic, sclera injected, mucus membranes moist Neck:supple, no JVD, no bruits  Heart:S1S2 RRR without murmur, gallup, rub or click Lungs:clear without rales, rhonchi, or wheezes QIW:LNLG, non tender, + BS, do not palpate liver spleen or masses Ext:no lower ext edema, 2+ pedal pulses, 2+ radial pulses Neuro:sleepy but alert and oriented, MAE, follows commands, + facial symmetry    Assessment/Plan Principal Problem:   New onset a-fib- may be from etoh and/or combination of drugs. Will check TSH--in process. Pt denies ever having before.  Echo pending CHADS2 VASc2 risk 0- though we do  not have echo results yet.  Heparin continues until results of echo.  Active Problems:   Polysubstance abuse--discussed importance of not using pseudoephedrine in  OTC, as well of danger of not knowing what he is using on the street.  I discussed this with pt alone at pt's request.  Family not aware of drug use.   Atrial fibrillation with rapid ventricular response- converted to SR.    Atrial fibrillation with RVR   Overdose of heroin- responded to narcan  MD to see for further recommendations.  Cecilie Kicks  Nurse Practitioner Certified [place] Pager [phone] or after 5pm or weekends call [phone] 01/10/2014, 2:18 PM     Patient examined chart reviewed Discussed arrhythmia with patient and father.  Echo also reviewed and normal Would only d/c on 81 mg of ASA  Discussed ppt of arrhythmia and possible stroke from drug use.  Patient understands F/U ECG Normal Sinus with no abnormalities or pre excitation.  No further cardiac w/u needed  Jeffery Fisher

## 2014-01-10 NOTE — ED Notes (Signed)
Pt reports having a fifth of alcohol and snorting a line of heroin.

## 2014-01-10 NOTE — ED Notes (Signed)
Pt BIB EMS, reports he used "pink tinged" heroin around 0000. Pt noted to be lethargic, 1mg  Narcan given en route and pt became more responsive. Pt removed IV upon admission to ED. Pt noted to be tachycardic with EMS and 97.1 temporal temp. Pt given hot packs. Pt denies SI, but states he believes it was "laced with something because it usually isn't pink." Pt a&o x4 at this time

## 2014-01-10 NOTE — Progress Notes (Signed)
Stopped Cardizem drip; pt NSR with rate maintaining in 70/80s with no s/s of distress; will continue to monitor; awaiting cardiology to see patient at this time

## 2014-01-10 NOTE — Progress Notes (Signed)
*  PRELIMINARY RESULTS* Echocardiogram 2D Echocardiogram has been performed.  Jeffery PullerJohanna Fisher Jeffery Fisher 01/10/2014, 11:20 AM

## 2014-01-10 NOTE — Progress Notes (Signed)
ANTICOAGULATION CONSULT NOTE - Initial Consult  Pharmacy Consult for Heparin Indication: atrial fibrillation  No Known Allergies  Patient Measurements: Height: 6\' 1"  (185.4 cm) Weight: 190 lb (86.183 kg) IBW/kg (Calculated) : 79.9 Heparin Dosing Weight:   Vital Signs: Temp: 98 F (36.7 C) (04/17 0219) Temp src: Rectal (04/17 0219) BP: 114/79 mmHg (04/17 0400) Pulse Rate: 112 (04/17 0457)  Labs:  Recent Labs  01/10/14 0215  HGB 15.1  HCT 45.6  PLT 220  CREATININE 0.99    Estimated Creatinine Clearance: 124.4 ml/min (by C-G formula based on Cr of 0.99).   Medical History: Past Medical History  Diagnosis Date  . ADHD (attention deficit hyperactivity disorder)     Medications:  Infusions:  . diltiazem (CARDIZEM) infusion 10 mg/hr (01/10/14 0328)  . heparin    . heparin      Assessment: Patient with new onset afib.  Heparin per pharmacy for afib.  Goal of Therapy:  Heparin level 0.3-0.7 units/ml Monitor platelets by anticoagulation protocol: Yes   Plan:  Heparin bolus 2500 units iv x1 Heparin drip at 1650 units/hr Daily heparin level and CBC Next heparin level at  1300    [employer] Specialty ChemicalsJulian Crowford Kailene Steinhart Jr. 01/10/2014,5:12 AM

## 2014-01-10 NOTE — Progress Notes (Signed)
[facility] PROGRESS NOTE  Jeffery Fisher WUJ:[id number] DOB: 30-Apr-1986 DOA: 01/10/2014 PCP: Loreen FreudYvonne Lowne, DO  Assessment/Plan: Principal Problem:  New onset a-fib  Active Problems:  Polysubstance abuse  Atrial fibrillation with rapid ventricular response  Atrial fibrillation with RVR   28 y.o. male with polysubstance abuse who is brought in for heroin overdose and found to be a fib RVR  1. New onset A-Fib with RVR - likely related to one of a number of known or unknown substances ingested recently. -converted to NSR; transition to PO cardizem; obtain echo, TSH, CXR; cardiology eval  2. Polysubstance abuse - monitor on CIWA for signs of opoid and/or EtOH withdrawal   Code Status: full Family Communication:  D/w patient, he declined to contact his family  (indicate person spoken with, relationship, and if by phone, the number) Disposition Plan: home 24-48 hours    Consultants:  Cardiology   Procedures:  Pend echo   Antibiotics:  None  (indicate start date, and stop date if known)  HPI/Subjective: alert  Objective: Filed Vitals:   01/10/14 0540  BP: 126/69  Pulse: 91  Temp: 97.4 F (36.3 C)  Resp: 19    Intake/Output Summary (Last 24 hours) at 01/10/14 0759 Last data filed at 01/10/14 0600  Gross per 24 hour  Intake   1000 ml  Output    725 ml  Net    275 ml   Filed Weights   01/10/14 0217  Weight: 86.183 kg (190 lb)    Exam:   General:  alert  Cardiovascular: s1,s2 rrr  Respiratory: CTA BL  Abdomen: soft,nt,nd   Musculoskeletal: no LE edema   Data Reviewed: Basic Metabolic Panel:  Recent Labs Lab 01/10/14 0215  NA 139  K 4.0  CL 100  CO2 25  GLUCOSE 186*  BUN 10  CREATININE 0.99  CALCIUM 8.6   Liver Function Tests: No results found for this basename: AST, ALT, ALKPHOS, BILITOT, PROT, ALBUMIN,  in the last 168 hours No results found for this basename: LIPASE, AMYLASE,  in the last 168 hours No results found for this  basename: AMMONIA,  in the last 168 hours CBC:  Recent Labs Lab 01/10/14 0215  WBC 9.4  NEUTROABS 5.2  HGB 15.1  HCT 45.6  MCV 98.1  PLT 220   Cardiac Enzymes:  Recent Labs Lab 01/10/14 0500  TROPONINI <0.30   BNP (last 3 results) No results found for this basename: PROBNP,  in the last 8760 hours CBG: No results found for this basename: GLUCAP,  in the last 168 hours  Recent Results (from the past 240 hour(s))  MRSA PCR SCREENING     Status: None   Collection Time    01/10/14  6:16 AM      Result Value Ref Range Status   MRSA by PCR NEGATIVE  NEGATIVE Final   Comment:            The GeneXpert MRSA Assay (FDA     approved for NASAL specimens     only), is one component of a     comprehensive MRSA colonization     surveillance program. It is not     intended to diagnose MRSA     infection nor to guide or     monitor treatment for     MRSA infections.     Studies: No results found.  Scheduled Meds: . diltiazem  30 mg Oral 4 times per day  . sodium chloride  3 mL  Intravenous Q12H   Continuous Infusions: . heparin 1,650 Units/hr (01/10/14 0555)    Principal Problem:   New onset a-fib Active Problems:   Polysubstance abuse   Atrial fibrillation with rapid ventricular response   Atrial fibrillation with RVR    Time spent: >35 minutes     Esperanza SheetsUlugbek N Gerrit Rafalski  [facility] Pager [phone]. If 7PM-7AM, please contact night-coverage at [email or web address], password [email or web address] 01/10/2014, 7:59 AM  LOS: 0 days

## 2014-01-10 NOTE — Progress Notes (Signed)
ANTICOAGULATION CONSULT NOTE - Initial Consult  Pharmacy Consult for Heparin Indication: atrial fibrillation  No Known Allergies  Patient Measurements: Height: 6\' 1"  (185.4 cm) Weight: 190 lb (86.183 kg) IBW/kg (Calculated) : 79.9 Heparin Dosing Weight: 86 kg  Vital Signs: Temp: 97.9 F (36.6 C) (04/17 1200) Temp src: Oral (04/17 1200) BP: 121/68 mmHg (04/17 1400) Pulse Rate: 76 (04/17 1400)  Labs:  Recent Labs  01/10/14 0215 01/10/14 0500 01/10/14 0535 01/10/14 1332  HGB 15.1  --   --   --   HCT 45.6  --   --   --   PLT 220  --   --   --   APTT  --   --  21*  --   LABPROT  --   --  12.5  --   INR  --   --  0.95  --   HEPARINUNFRC  --   --   --  0.81*  CREATININE 0.99  --   --   --   TROPONINI  --  <0.30  --   --    Estimated Creatinine Clearance: 124.4 ml/min (by C-G formula based on Cr of 0.99).  Medications:  Infusions:  . heparin     Assessment: [age] yoM with new onset of Afib after Heroin overdose. Urine drug screen positive for Opiates, THC, and ETOH level elevated on admission. Found unresponsive-required Narcan per EMS. Diltiazem infusion and Heparin infusion by pharmacy  Heparin bolus 2500 units iv x1, thenHeparin drip at 1650 units/hr  First Heparin level 0.81 units/ml - slightly above therapeutic range  Diltiazem infusion stopped this afternoon  Goal of Therapy:  Heparin level 0.3-0.7 units/ml Monitor platelets by anticoagulation protocol: Yes   Plan:   Reduce Heparin infusion to 1500 units/hr (15 ml/hr)  Recheck Heparin level at 8pm  Daily CBC and Heparin level  Otho BellowsGreen, Isamar Nazir L PharmD Pager [phone] 01/10/2014, 2:28 PM

## 2014-01-10 NOTE — H&P (Signed)
[facility] History and Physical  Jeffery DoorRyan E Lynes ZOX:[id number] DOB: [birth year] DOA: 01/10/2014  Referring physician: EDP PCP: Loreen FreudYvonne Lowne, DO   Chief Complaint: Heroin overdose   HPI: Jeffery Fisher is a 28 y.o. male who is brought in for heroin overdose.  He was initially unresponsive earlier this evening, EMS was summoned and he was given narcan which brought him back.  He admits to snorting a line of heroin this evening, heroin was pink which is not its usual color so he suspects it may have been laced with something.  Additionally he uses cocaine (his story keeps changing on when his last use was, yesterday vs 2 weeks ago).  He admits to drinking EtOH all day today.  States he hasnt used adderall for a while and only uses it for studying.  Review of Systems: Systems reviewed.  As above, otherwise negative  Past Medical History  Diagnosis Date  . ADHD (attention deficit hyperactivity disorder)    History reviewed. No pertinent past surgical history. Social History:  reports that he has been smoking Cigarettes.  He has a 5 pack-year smoking history. He has never used smokeless tobacco. He reports that he drinks about 1.2 ounces of alcohol per week. He reports that he uses illicit drugs (Cocaine).  No Known Allergies  History reviewed. No pertinent family history.   Prior to Admission medications   Not on File   Physical Exam: Filed Vitals:   01/10/14 0457  BP:   Pulse: 112  Temp:   Resp: 9    BP 114/79  Pulse 112  Temp(Src) 98 F (36.7 C) (Rectal)  Resp 9  Ht 6\' 1"  (1.854 m)  Wt 86.183 kg (190 lb)  BMI 25.07 kg/m2  SpO2 99%  General Appearance:    Alert, oriented, no distress, appears stated age  Head:    Normocephalic, atraumatic  Eyes:    PERRL, EOMI, sclera non-icteric        Nose:   Nares without drainage or epistaxis. Mucosa, turbinates normal  Throat:   Moist mucous membranes. Oropharynx without erythema or exudate.  Neck:   Supple. No carotid  bruits.  No thyromegaly.  No lymphadenopathy.   Back:     No CVA tenderness, no spinal tenderness  Lungs:     Clear to auscultation bilaterally, without wheezes, rhonchi or rales  Chest wall:    No tenderness to palpitation  Heart:    Regular rate and rhythm without murmurs, gallops, rubs  Abdomen:     Soft, non-tender, nondistended, normal bowel sounds, no organomegaly  Genitalia:    deferred  Rectal:    deferred  Extremities:   No clubbing, cyanosis or edema.  Pulses:   2+ and symmetric all extremities  Skin:   Skin color, texture, turgor normal, no rashes or lesions  Lymph nodes:   Cervical, supraclavicular, and axillary nodes normal  Neurologic:   CNII-XII intact. Normal strength, sensation and reflexes      throughout    Labs on Admission:  Basic Metabolic Panel:  Recent Labs Lab 01/10/14 0215  NA 139  K 4.0  CL 100  CO2 25  GLUCOSE 186*  BUN 10  CREATININE 0.99  CALCIUM 8.6   Liver Function Tests: No results found for this basename: AST, ALT, ALKPHOS, BILITOT, PROT, ALBUMIN,  in the last 168 hours No results found for this basename: LIPASE, AMYLASE,  in the last 168 hours No results found for this basename: AMMONIA,  in the last 168 hours  CBC:  Recent Labs Lab 01/10/14 0215  WBC 9.4  NEUTROABS 5.2  HGB 15.1  HCT 45.6  MCV 98.1  PLT 220   Cardiac Enzymes: No results found for this basename: CKTOTAL, CKMB, CKMBINDEX, TROPONINI,  in the last 168 hours  BNP (last 3 results) No results found for this basename: PROBNP,  in the last 8760 hours CBG: No results found for this basename: GLUCAP,  in the last 168 hours  Radiological Exams on Admission: No results found.  EKG: Independently reviewed.  Assessment/Plan Principal Problem:   New onset a-fib Active Problems:   Polysubstance abuse   Atrial fibrillation with rapid ventricular response   Atrial fibrillation with RVR   1. New onset A-Fib with RVR - likely related to one of a number of known or  unknown substances ingested recently.  Rate control with cardizem, probably needs cards consult in AM. 2. Polysubstance abuse - monitor for signs of opoid and/or EtOH withdrawal during stay.  Treat if these develop.    Code Status: Full Code  Family Communication: No family in room Disposition Plan: Admit to SDU   Time spent: 70 min  Hillary BowJared M Gardner [facility] Pager [phone]  If 7AM-7PM, please contact the day team taking care of the patient [email or web address] Password [email or web address] 01/10/2014, 5:03 AM

## 2014-01-11 DIAGNOSIS — F1010 Alcohol abuse, uncomplicated: Secondary | ICD-10-CM

## 2014-01-11 LAB — CBC
HCT: 42.4 % (ref 39.0–52.0)
HEMOGLOBIN: 14 g/dL (ref 13.0–17.0)
MCH: 31.9 pg (ref 26.0–34.0)
MCHC: 33 g/dL (ref 30.0–36.0)
MCV: 96.6 fL (ref 78.0–100.0)
Platelets: 214 10*3/uL (ref 150–400)
RBC: 4.39 MIL/uL (ref 4.22–5.81)
RDW: 13 % (ref 11.5–15.5)
WBC: 7.2 10*3/uL (ref 4.0–10.5)

## 2014-01-11 MED ORDER — ASPIRIN EC 81 MG PO TBEC: 81.0000 mg | DELAYED_RELEASE_TABLET | Freq: Every day | ORAL | Status: AC

## 2014-01-11 NOTE — Discharge Summary (Signed)
Physician Discharge Summary  Jeffery Fisher ZOX:[id number] DOB: 10/18/1985 DOA: 01/10/2014  PCP: Loreen FreudYvonne Lowne, DO  Admit date: 01/10/2014 Discharge date: 01/11/2014  Time spent: >35 minutes  Recommendations for Outpatient Follow-up:  F/u with PCP in 1-2 weeks Discharge Diagnoses:  Principal Problem:   New onset a-fib Active Problems:   Polysubstance abuse   Atrial fibrillation with rapid ventricular response   Atrial fibrillation with RVR   Overdose of heroin   Discharge Condition: stbale   Diet recommendation: regular   Filed Weights   01/10/14 0217 01/10/14 1631  Weight: 86.183 kg (190 lb) 86.183 kg (190 lb)    History of present illness:  Principal Problem:  New onset a-fib  Active Problems:  Polysubstance abuse  Atrial fibrillation with rapid ventricular response  Atrial fibrillation with RVR    Hospital Course:  28 y.o. male with polysubstance abuse who is brought in for heroin overdose and found to be a fib RVR  1. New onset A-Fib with RVR - likely related to one of a number of known or unknown substances ingested recently.  -converted to NSR; echo is unremarkable; TSH wnl; cardiology reccommended no further evaluation at this time, except  ASA  2. Polysubstance abuse, recommended to stop using drugs     Procedures: Echo: Left ventricle: The cavity size was normal. Systolic function was normal. The estimated ejection fraction was in the range of 60% to 65%. Wall motion was normal; there were no regional wall motion abnormalities. - Atrial septum: No defect or patent foramen ovale was identified.    (i.e. Studies not automatically included, echos, thoracentesis, etc; not x-rays)  Consultations:  Cardiology   Discharge Exam: Filed Vitals:   01/11/14 0446  BP: 125/79  Pulse: 60  Temp: 97.7 F (36.5 C)  Resp: 14    General: alert Cardiovascular: s1,s2 rrr Respiratory: CTA BL  Discharge Instructions  Discharge Orders   Future Orders Complete  By Expires   Diet - low sodium heart healthy  As directed    Discharge instructions  As directed    Increase activity slowly  As directed        Medication List    STOP taking these medications       amphetamine-dextroamphetamine 20 MG 24 hr capsule  Commonly known as:  ADDERALL XR      TAKE these medications       aspirin EC 81 MG tablet  Take 1 tablet (81 mg total) by mouth daily.       No Known Allergies     Follow-up Information   Follow up with Loreen FreudYvonne Lowne, DO In 1 week.   Specialty:  Family Medicine   Contact information:   [phone] [place] [id number] [phone]        The results of significant diagnostics from this hospitalization (including imaging, microbiology, ancillary and laboratory) are listed below for reference.    Significant Diagnostic Studies: Dg Chest 1 View  01/10/2014   CLINICAL DATA:  Cough, smoker  EXAM: CHEST - 1 VIEW  COMPARISON:  03/05/2013  FINDINGS: Cardiomediastinal silhouette is unremarkable. No acute infiltrate or pleural effusion. No pulmonary edema. Central mild bronchitic changes. Bony thorax is stable.  IMPRESSION: No acute infiltrate or pulmonary edema. Central mild bronchitic changes.   Electronically Signed   By: Natasha MeadLiviu  Pop M.D.   On: 01/10/2014 09:48    Microbiology: Recent Results (from the past 240 hour(s))  MRSA PCR SCREENING     Status: None  Collection Time    01/10/14  6:16 AM      Result Value Ref Range Status   MRSA by PCR NEGATIVE  NEGATIVE Final   Comment:            The GeneXpert MRSA Assay (FDA     approved for NASAL specimens     only), is one component of a     comprehensive MRSA colonization     surveillance program. It is not     intended to diagnose MRSA     infection nor to guide or     monitor treatment for     MRSA infections.     Labs: Basic Metabolic Panel:  Recent Labs Lab 01/10/14 0215  NA 139  K 4.0  CL 100  CO2 25  GLUCOSE 186*  BUN 10  CREATININE 0.99   CALCIUM 8.6   Liver Function Tests: No results found for this basename: AST, ALT, ALKPHOS, BILITOT, PROT, ALBUMIN,  in the last 168 hours No results found for this basename: LIPASE, AMYLASE,  in the last 168 hours No results found for this basename: AMMONIA,  in the last 168 hours CBC:  Recent Labs Lab 01/10/14 0215 01/11/14 0415  WBC 9.4 7.2  NEUTROABS 5.2  --   HGB 15.1 14.0  HCT 45.6 42.4  MCV 98.1 96.6  PLT 220 214   Cardiac Enzymes:  Recent Labs Lab 01/10/14 0500  TROPONINI <0.30   BNP: BNP (last 3 results) No results found for this basename: PROBNP,  in the last 8760 hours CBG: No results found for this basename: GLUCAP,  in the last 168 hours     Signed:  Esperanza SheetsUlugbek N Kein Carlberg  [facility] 01/11/2014, 9:18 AM

## 2014-01-13 ENCOUNTER — Telehealth: Payer: Self-pay | Admitting: *Deleted

## 2014-01-13 NOTE — Telephone Encounter (Signed)
Spoke with patient who states he does not have insurance at this time and does not want to schedule appt.   Patient was admitted on Friday 01/10/14 with Afib RVR due to a suspected heroine overdose. Patient also had a UDS that was +ETOH (276) and +opiates in the hospital.   Patient had high risk UDS on 10/2013 at [facility]  because he was +THC and +opiates, neg for Adderall.

## 2014-01-13 NOTE — Telephone Encounter (Signed)
Noted--no more adderall rx

## 2015-03-26 IMAGING — CR DG CHEST 1V
1 series · 1 of 1 positions shown · non-contrast
Comparison: 03/05/2013

CLINICAL DATA: Cough, smoker

EXAM:
CHEST - 1 VIEW

[AP]
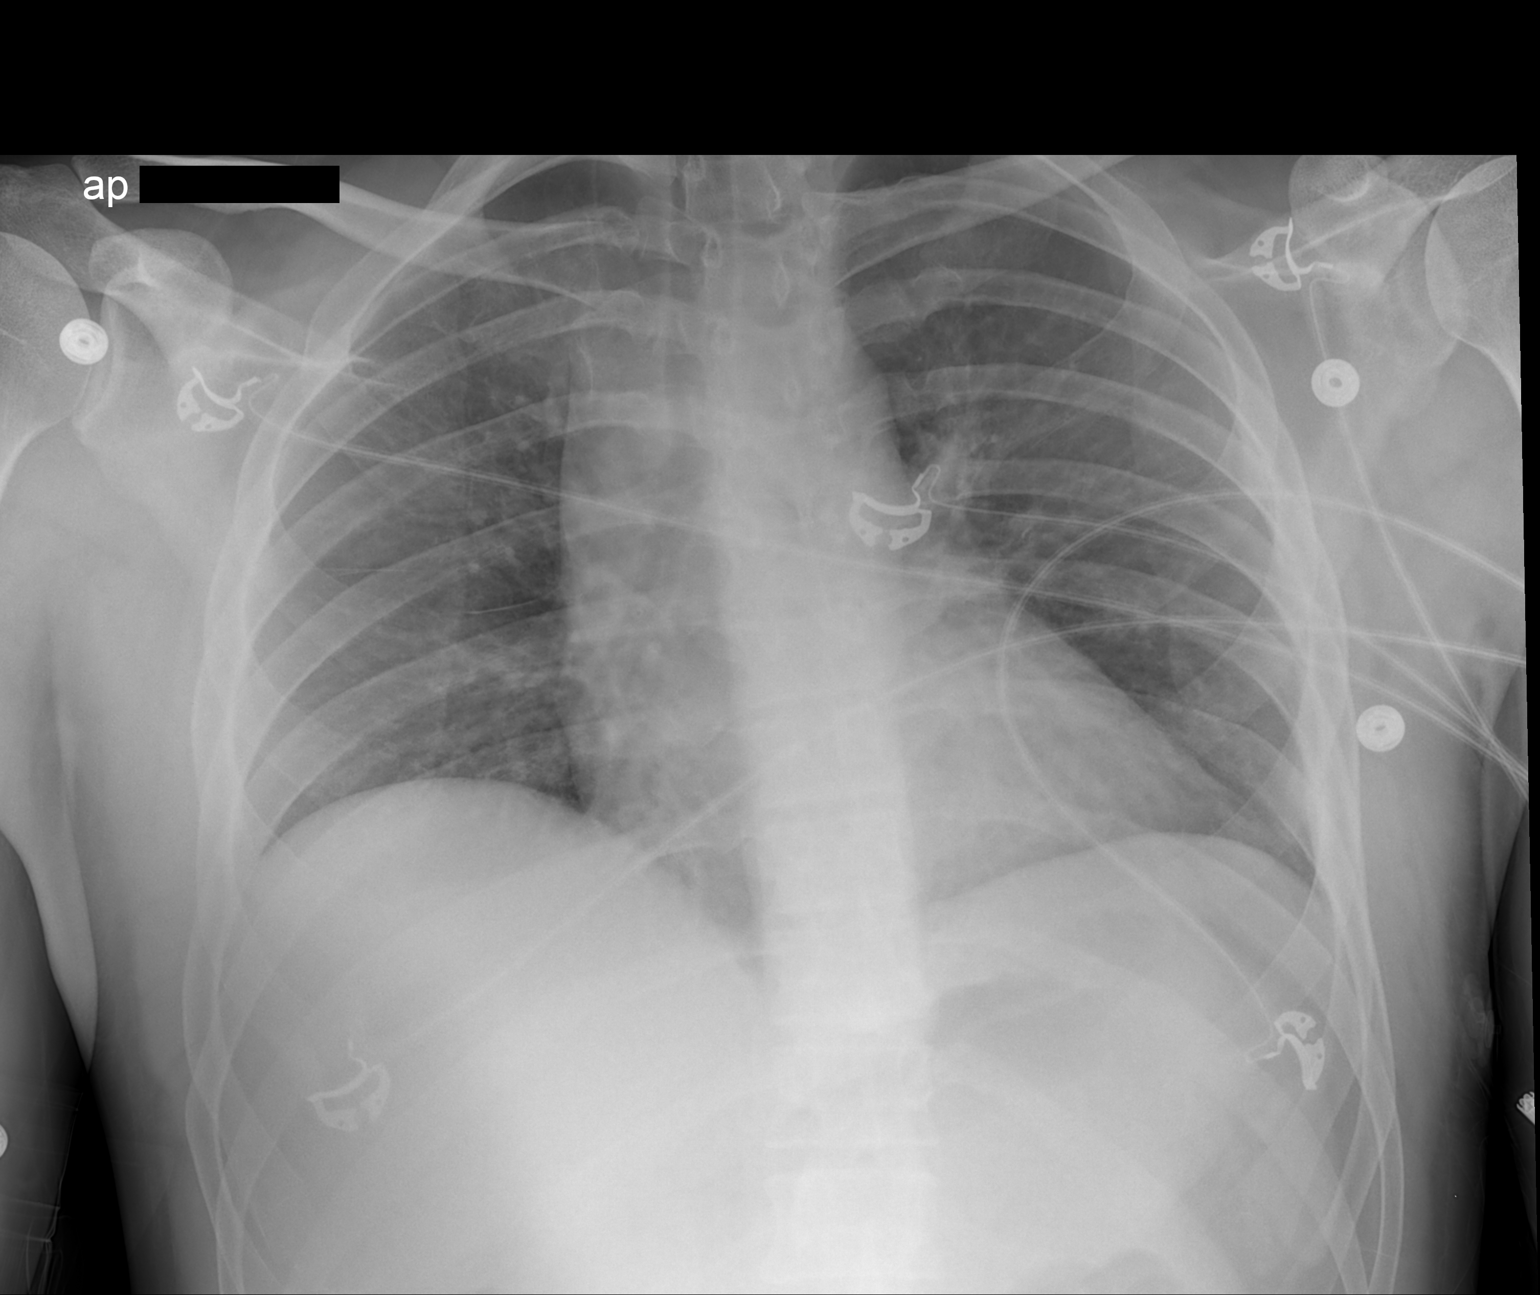

[1 of 1 positions shown; findings below may reference images not displayed]

FINDINGS: Cardiomediastinal silhouette is unremarkable. No acute infiltrate or
pleural effusion. No pulmonary edema. Central mild bronchitic
changes. Bony thorax is stable.
IMPRESSION: No acute infiltrate or pulmonary edema. Central mild bronchitic
changes.

## 2019-09-13 ENCOUNTER — Other Ambulatory Visit: Payer: Self-pay

## 2019-09-13 DIAGNOSIS — Z20822 Contact with and (suspected) exposure to covid-19: Secondary | ICD-10-CM

## 2019-09-14 LAB — NOVEL CORONAVIRUS, NAA: SARS-CoV-2, NAA: NOT DETECTED

## 2020-06-16 ENCOUNTER — Emergency Department (HOSPITAL_COMMUNITY)
Admission: EM | Admit: 2020-06-16 | Discharge: 2020-06-17 | Disposition: A | Discharge status: Home / Self Care | Payer: Self-pay | Attending: Emergency Medicine | Admitting: Emergency Medicine

## 2020-06-16 ENCOUNTER — Other Ambulatory Visit: Payer: Self-pay

## 2020-06-16 DIAGNOSIS — F17210 Nicotine dependence, cigarettes, uncomplicated: Secondary | ICD-10-CM | POA: Insufficient documentation

## 2020-06-16 DIAGNOSIS — F10920 Alcohol use, unspecified with intoxication, uncomplicated: Secondary | ICD-10-CM

## 2020-06-16 DIAGNOSIS — F10129 Alcohol abuse with intoxication, unspecified: Secondary | ICD-10-CM | POA: Insufficient documentation

## 2020-06-16 DIAGNOSIS — Z7982 Long term (current) use of aspirin: Secondary | ICD-10-CM | POA: Insufficient documentation

## 2020-06-16 NOTE — ED Notes (Signed)
Due to patient behavior, staff was unable to obtain vitals at this time

## 2020-06-16 NOTE — ED Triage Notes (Signed)
Patient found passed out on the sidewalk, currently the patient has slurred speech, using profanity and refusing everything at this time.

## 2020-06-17 NOTE — ED Notes (Signed)
Patient came up to the desk again stating that he is leaving and was refusing to go back to bed.  Security and ED provider has been called and is at the bedside.

## 2020-06-17 NOTE — ED Notes (Signed)
Patient ambulatory to the desk, with and unsteady gait, demanding to leave.  ED provider made aware.  The patient was escorted back to the room and onto the stretcher.

## 2020-06-17 NOTE — ED Provider Notes (Signed)
[facility]-EMERGENCY DEPT Provider Note  CSN: [id number] Arrival date & time: 06/16/20 2318  Chief Complaint(s) Alcohol Intoxication (Patient found passed out on the sidewalk.)  HPI Jeffery Fisher is a 34 y.o. male with a past medical history listed below including polysubstance abuse and alcohol abuse who presents here after being found down unresponsive.  On my assessment, patient was awake/alert but obviously intoxicated.  Admits to heavy alcohol consumption stating that he drinks 1/5 of brown liquor per day, " at least."  He denies any current physical complaints.  Denies any SI/HI/AVH.  He is demanding help with his alcohol requesting rehabilitation.  HPI  Past Medical History Past Medical History:  Diagnosis Date  . ADHD (attention deficit hyperactivity disorder)    Patient Active Problem List   Diagnosis Date Noted  . New onset a-fib (HCC) 01/10/2014  . Polysubstance abuse (HCC) 01/10/2014  . Atrial fibrillation with rapid ventricular response (HCC) 01/10/2014  . Atrial fibrillation with RVR (HCC) 01/10/2014  . Overdose of heroin (HCC) 01/10/2014  . ADHD (attention deficit hyperactivity disorder) 11/07/2013   Home Medication(s) Prior to Admission medications   Medication Sig Start Date End Date Taking? Authorizing Provider  aspirin EC 81 MG tablet Take 1 tablet (81 mg total) by mouth daily. 01/11/14   Esperanza Sheets, MD                                                                                                                                    Past Surgical History No past surgical history on file. Family History Family History  Problem Relation Age of Onset  . Healthy Mother   . Healthy Father   . Healthy Sister   . Healthy Sister     Social History Social History   Tobacco Use  . Smoking status: Current Every Day Smoker    Packs/day: 0.50    Years: 10.00    Pack years: 5.00    Types: Cigarettes  . Smokeless tobacco: Never Used    Substance Use Topics  . Alcohol use: Yes    Alcohol/week: 2.0 standard drinks    Types: 2 Cans of beer per week  . Drug use: Yes    Types: Cocaine    Comment: Heroin   Allergies Patient has no known allergies.  Review of Systems Review of Systems All other systems are reviewed and are negative for acute change except as noted in the HPI  Physical Exam Vital Signs  I have reviewed the triage vital signs BP (!) 140/101   Pulse (!) 113   Temp 98.2 F (36.8 C) (Oral)   Resp 20   SpO2 96%   Physical Exam Vitals reviewed.  Constitutional:      General: He is not in acute distress.    Appearance: He is well-developed. He is not diaphoretic.     Comments: intoxicated  HENT:     Head: Normocephalic  and atraumatic.     Nose: Nose normal.  Eyes:     General: No scleral icterus.       Right eye: No discharge.        Left eye: No discharge.     Conjunctiva/sclera: Conjunctivae normal.     Pupils: Pupils are equal, round, and reactive to light.  Cardiovascular:     Rate and Rhythm: Normal rate and regular rhythm.     Heart sounds: No murmur heard.  No friction rub. No gallop.   Pulmonary:     Effort: Pulmonary effort is normal. No respiratory distress.     Breath sounds: Normal breath sounds. No stridor. No rales.  Abdominal:     General: There is no distension.     Palpations: Abdomen is soft.     Tenderness: There is no abdominal tenderness.  Musculoskeletal:        General: No tenderness.     Cervical back: Normal range of motion and neck supple.  Skin:    General: Skin is warm and dry.     Findings: No erythema or rash.  Neurological:     Mental Status: He is alert and oriented to person, place, and time.     Comments: Follows commands Moves all  extremities     ED Results and Treatments Labs (all labs ordered are listed, but only abnormal results are displayed) Labs Reviewed - No data to display                                                                                                                        EKG  EKG Interpretation  Date/Time:    Ventricular Rate:    PR Interval:    QRS Duration:   QT Interval:    QTC Calculation:   R Axis:     Text Interpretation:        Radiology No results found.  Pertinent labs & imaging results that were available during my care of the patient were reviewed by me and considered in my medical decision making (see chart for details).  Medications Ordered in ED Medications - No data to display                                                                                                                                  Procedures Procedures  (including critical care time)  Medical Decision Making / ED  Course I have reviewed the nursing notes for this encounter and the patient's prior records (if available in EHR or on provided paperwork).   [name] was evaluated in Emergency Department on 06/17/2020 for the symptoms described in the history of present illness. He was evaluated in the context of the global COVID-19 pandemic, which necessitated consideration that the patient might be at risk for infection with the SARS-CoV-2 virus that causes COVID-19. Institutional protocols and algorithms that pertain to the evaluation of patients at risk for COVID-19 are in a state of rapid change based on information released by regulatory bodies including the CDC and federal and state organizations. These policies and algorithms were followed during the patient's care in the ED.  Patient presents for alcohol intoxication.. No evidence of trauma requiring imaging at this time. Patient is intermittently uncooperative but able to be redirected. Still unsteady on his feet and will require additional monitoring.  Patient allowed to metabolize to freedom.  Discharged to the care of his friend.      Final Clinical Impression(s) / ED Diagnoses Final diagnoses:  Alcoholic intoxication without  complication (HCC)    The patient appears reasonably screened and/or stabilized for discharge and I doubt any other medical condition or other [facility] requiring further screening, evaluation, or treatment in the ED at this time prior to discharge. Safe for discharge with strict return precautions.  Disposition: Discharge  Condition: Good  I have discussed the results, Dx and Tx plan with the patient/family who expressed understanding and agree(s) with the plan. Discharge instructions discussed at length. The patient/family was given strict return precautions who verbalized understanding of the instructions. No further questions at time of discharge.    ED Discharge Orders    None       Follow Up: Donato Schultz, DO [address] [place] [address] [phone]  Schedule an appointment as soon as possible for a visit      This chart was dictated using voice recognition software.  Despite best efforts to proofread,  errors can occur which can change the documentation meaning.   Nira Conn, MD 06/17/20 [phone]
